# Patient Record
Sex: Male | Born: 1960 | Race: White | Hispanic: No | Marital: Married | Smoking: Never smoker
Health system: Southern US, Community
[De-identification: ages and names within clinical notes are randomized; demographics above are authoritative.]

## PROBLEM LIST (undated history)

## (undated) DIAGNOSIS — I341 Nonrheumatic mitral (valve) prolapse: Secondary | ICD-10-CM

---

## 2015-08-29 ENCOUNTER — Emergency Department (HOSPITAL_BASED_OUTPATIENT_CLINIC_OR_DEPARTMENT_OTHER): Payer: 59

## 2015-08-29 ENCOUNTER — Encounter (HOSPITAL_BASED_OUTPATIENT_CLINIC_OR_DEPARTMENT_OTHER): Payer: Self-pay | Admitting: *Deleted

## 2015-08-29 ENCOUNTER — Emergency Department (HOSPITAL_BASED_OUTPATIENT_CLINIC_OR_DEPARTMENT_OTHER)
Admission: EM | Admit: 2015-08-29 | Discharge: 2015-08-29 | Disposition: A | Discharge status: Home / Self Care | Payer: 59 | Attending: Emergency Medicine | Admitting: Emergency Medicine

## 2015-08-29 DIAGNOSIS — N23 Unspecified renal colic: Secondary | ICD-10-CM | POA: Insufficient documentation

## 2015-08-29 DIAGNOSIS — Z8679 Personal history of other diseases of the circulatory system: Secondary | ICD-10-CM | POA: Insufficient documentation

## 2015-08-29 DIAGNOSIS — R109 Unspecified abdominal pain: Secondary | ICD-10-CM | POA: Diagnosis present

## 2015-08-29 HISTORY — DX: Nonrheumatic mitral (valve) prolapse: I34.1

## 2015-08-29 LAB — URINALYSIS, ROUTINE W REFLEX MICROSCOPIC
BILIRUBIN URINE: NEGATIVE
Glucose, UA: NEGATIVE mg/dL
Ketones, ur: 15 mg/dL — AB
Leukocytes, UA: NEGATIVE
NITRITE: NEGATIVE
PROTEIN: 30 mg/dL — AB
Specific Gravity, Urine: 1.03 (ref 1.005–1.030)
pH: 6 (ref 5.0–8.0)

## 2015-08-29 LAB — URINE MICROSCOPIC-ADD ON

## 2015-08-29 MED ORDER — TAMSULOSIN HCL 0.4 MG PO CAPS: 0.4000 mg | ORAL_CAPSULE | Freq: Every day | ORAL | Status: DC

## 2015-08-29 MED ORDER — HYDROCODONE-ACETAMINOPHEN 5-325 MG PO TABS
1.0000 | ORAL_TABLET | Freq: Four times a day (QID) | ORAL | Status: AC | PRN
Start: 2015-08-29 — End: ?

## 2015-08-29 MED ORDER — IBUPROFEN 800 MG PO TABS: 800.0000 mg | ORAL_TABLET | Freq: Three times a day (TID) | ORAL | Status: AC

## 2015-08-29 NOTE — ED Notes (Signed)
Pt states awoke at 0300hrs with acute rt flank pain, at approx 0800hrs went to urgent care, rec Toradol and Norco, now pain free.

## 2015-08-29 NOTE — ED Notes (Signed)
Right flank pain since 0300

## 2015-08-29 NOTE — Discharge Instructions (Signed)
Take motrin 800 mg every 6 hrs for the next 2 days then as needed.   Take vicodin if you have severe pain.   Take flomax daily to help you urinate the stone.   Call urology in a couple days if you still have pain.   Drink plenty of fluids.   Return to ER if you have severe pain, vomiting, fever, unable to urinate.

## 2015-08-29 NOTE — ED Notes (Signed)
Pt ambulating with no assistance, seemingly with no problems.

## 2015-08-29 NOTE — ED Notes (Signed)
DC instructions reviewed with pt and other, discussed safety while taking PO narcotics, also importance of taking Flomax also, Discussed when the need arises to return to the ED. Opportunity for questions provided

## 2015-08-29 NOTE — ED Notes (Signed)
MD at bedside. 

## 2015-08-29 NOTE — ED Provider Notes (Signed)
CSN: [id number]     Arrival date & time 08/29/15  1121 History   First MD Initiated Contact with Patient 08/29/15 1137     Chief Complaint  Patient presents with  . Flank Pain     (Consider location/radiation/quality/duration/timing/severity/associated sxs/prior Treatment) The history is provided by the patient.  Barry Wong is a 55 y.o. male hx of mitral valve prolapse here with R flank pain. Acute right flank pain started at 3 AM that woke him up. Pain was severe and sharp. Went to urgent care at 8 AM and received a Toradol and Phenergan IM and some norco, now pain free. Denies fever or vomiting. No hx of kidney stones. Denies dysuria or obvious hematuria. He drives around a lot and doesn't usually drink a lot of fluids.     Past Medical History  Diagnosis Date  . Mitral valve prolapse    History reviewed. No pertinent past surgical history. No family history on file. Social History  Substance Use Topics  . Smoking status: Never Smoker   . Smokeless tobacco: Never Used  . Alcohol Use: No    Review of Systems  Genitourinary: Positive for flank pain.  All other systems reviewed and are negative.     Allergies  Review of patient's allergies indicates no known allergies.  Home Medications   Prior to Admission medications   Not on File   BP 140/91 mmHg  Pulse 101  Temp(Src) 98.1 F (36.7 C) (Oral)  Resp 18  Ht  (1.753 m)  Wt 170 lb (77.111 kg)  BMI 25.09 kg/m2  SpO2 100% Physical Exam  Constitutional: He is oriented to person, place, and time. He appears well-developed and well-nourished.  HENT:  Head: Normocephalic.  Mouth/Throat: Oropharynx is clear and moist.  Eyes: Conjunctivae are normal. Pupils are equal, round, and reactive to light.  Neck: Normal range of motion. Neck supple.  Cardiovascular: Normal rate, regular rhythm and normal heart sounds.   Pulmonary/Chest: Effort normal and breath sounds normal. No respiratory distress. He has no wheezes. He  has no rales.  Abdominal: Soft. Bowel sounds are normal. He exhibits no distension. There is no tenderness. There is no rebound.  No CVAT   Musculoskeletal: Normal range of motion. He exhibits no edema or tenderness.  Neurological: He is alert and oriented to person, place, and time. No cranial nerve deficit. Coordination normal.  Skin: Skin is warm and dry.  Psychiatric: He has a normal mood and affect. His behavior is normal. Judgment and thought content normal.  Nursing note and vitals reviewed.   ED Course  Procedures (including critical care time)  EMERGENCY DEPARTMENT US RENAL EXAM  "Study: Limited Retroperitoneal Ultrasound of Kidneys"  INDICATIONS: Flank pain  Long and short axis of both kidneys were obtained.   PERFORMED BY: Myself  IMAGES ARCHIVED?: Yes  LIMITATIONS: Body habitus  VIEWS USED: Long axis and Short axis   INTERPRETATION: Right Hydronephrosis moderate   CPT Code: [phone] (limited retroperitoneal)    Labs Review Labs Reviewed  URINALYSIS, ROUTINE W REFLEX MICROSCOPIC (NOT AT [facility]) - Abnormal; Notable for the following:    Color, Urine AMBER (*)    APPearance CLOUDY (*)    Hgb urine dipstick LARGE (*)    Ketones, ur 15 (*)    Protein, ur 30 (*)    All other components within normal limits  URINE MICROSCOPIC-ADD ON - Abnormal; Notable for the following:    Squamous Epithelial / LPF 0-5 (*)    Bacteria, UA  MANY (*)    All other components within normal limits    Imaging Review Dg Abd 1 View  08/29/2015  CLINICAL DATA:  Acute right flank pain since 3 a.m. EXAM: ABDOMEN - 1 VIEW COMPARISON:  None available FINDINGS: Renal shadows are obscured by stool and gas pattern. 3 mm radiodense calculus at the level of the right L3 transverse process, nonspecific but could represent a small ureteral calculus. No acute osseous finding. IMPRESSION: Limited assessment for nephrolithiasis. Punctate 3 mm radiodense calculus at the right L3 transverse process level  by plain radiography, nonspecific. Electronically Signed   By: Judie PetitM.  Shick M.D.   On: 08/29/2015 12:24   Ct Renal Stone Study  08/29/2015  CLINICAL DATA:  55 year old male with acute right flank and abdominal pain and hematuria. EXAM: CT ABDOMEN AND PELVIS WITHOUT CONTRAST TECHNIQUE: Multidetector CT imaging of the abdomen and pelvis was performed following the standard protocol without IV contrast. COMPARISON:  None. FINDINGS: Please note that parenchymal abnormalities may be missed without intravenous contrast. Lower chest:  No acute abnormalities. Hepatobiliary: Hepatic steatosis identified without focal hepatic lesion. The gallbladder is unremarkable. There is no evidence of biliary dilatation. Pancreas: Unremarkable Spleen: Unremarkable Adrenals/Urinary Tract: A 4 mm upper-mid right ureteral calculus causes moderate right hydronephrosis and right perinephric inflammation. No other urinary calculi identified. The adrenal glands and bladder are unremarkable. Stomach/Bowel: Unremarkable. There is no evidence of bowel obstruction or definite focal bowel wall thickening. Vascular/Lymphatic: There is no evidence of abdominal aortic aneurysm or enlarged lymph nodes. Reproductive: Prostate enlargement noted. Other: No free fluid, focal collection or pneumoperitoneum. Musculoskeletal: No acute abnormalities noted. IMPRESSION: 4 mm upper-mid right ureteral calculus causing moderate right hydronephrosis. Hepatic steatosis. Prostate enlargement. Electronically Signed   By: Harmon PierJeffrey  Hu M.D.   On: 08/29/2015 13:15   I have personally reviewed and evaluated these images and lab results as part of my medical decision-making.   EKG Interpretation None      MDM   Final diagnoses:  None    Barry Wong is a 55 y.o. male here with R flank pain that resolved. No dysuria, afebrile, well appearing. Likely small kidney stone vs recently passed kidney stone. Urgent care sent him for evaluation. I discussed imaging  options including CT renal stone, xrays, US, and I think he can just get xray to r/o large stone or confirm stone passage.  1:28 PM  Xray showed 3 mm stone. US showed moderate hydro, which is more than I expect. So I ordered CT renal stone, which showed 4 mm mid R ureteral stone with moderate hydro. Afebrile, UA showed no infection. Didn't require pain meds in the ED. Will dc home with motrin, vicodin, flomax, urology referral     Richardean Canalavid H Yao, MD 08/29/15 1330

## 2017-08-22 IMAGING — DX DG ABDOMEN 1V
1 series · 1 of 1 positions shown · non-contrast
Comparison: None available

CLINICAL DATA: Acute right flank pain since 3 a.m..

EXAM:
ABDOMEN - 1 VIEW

[abdomen kub]
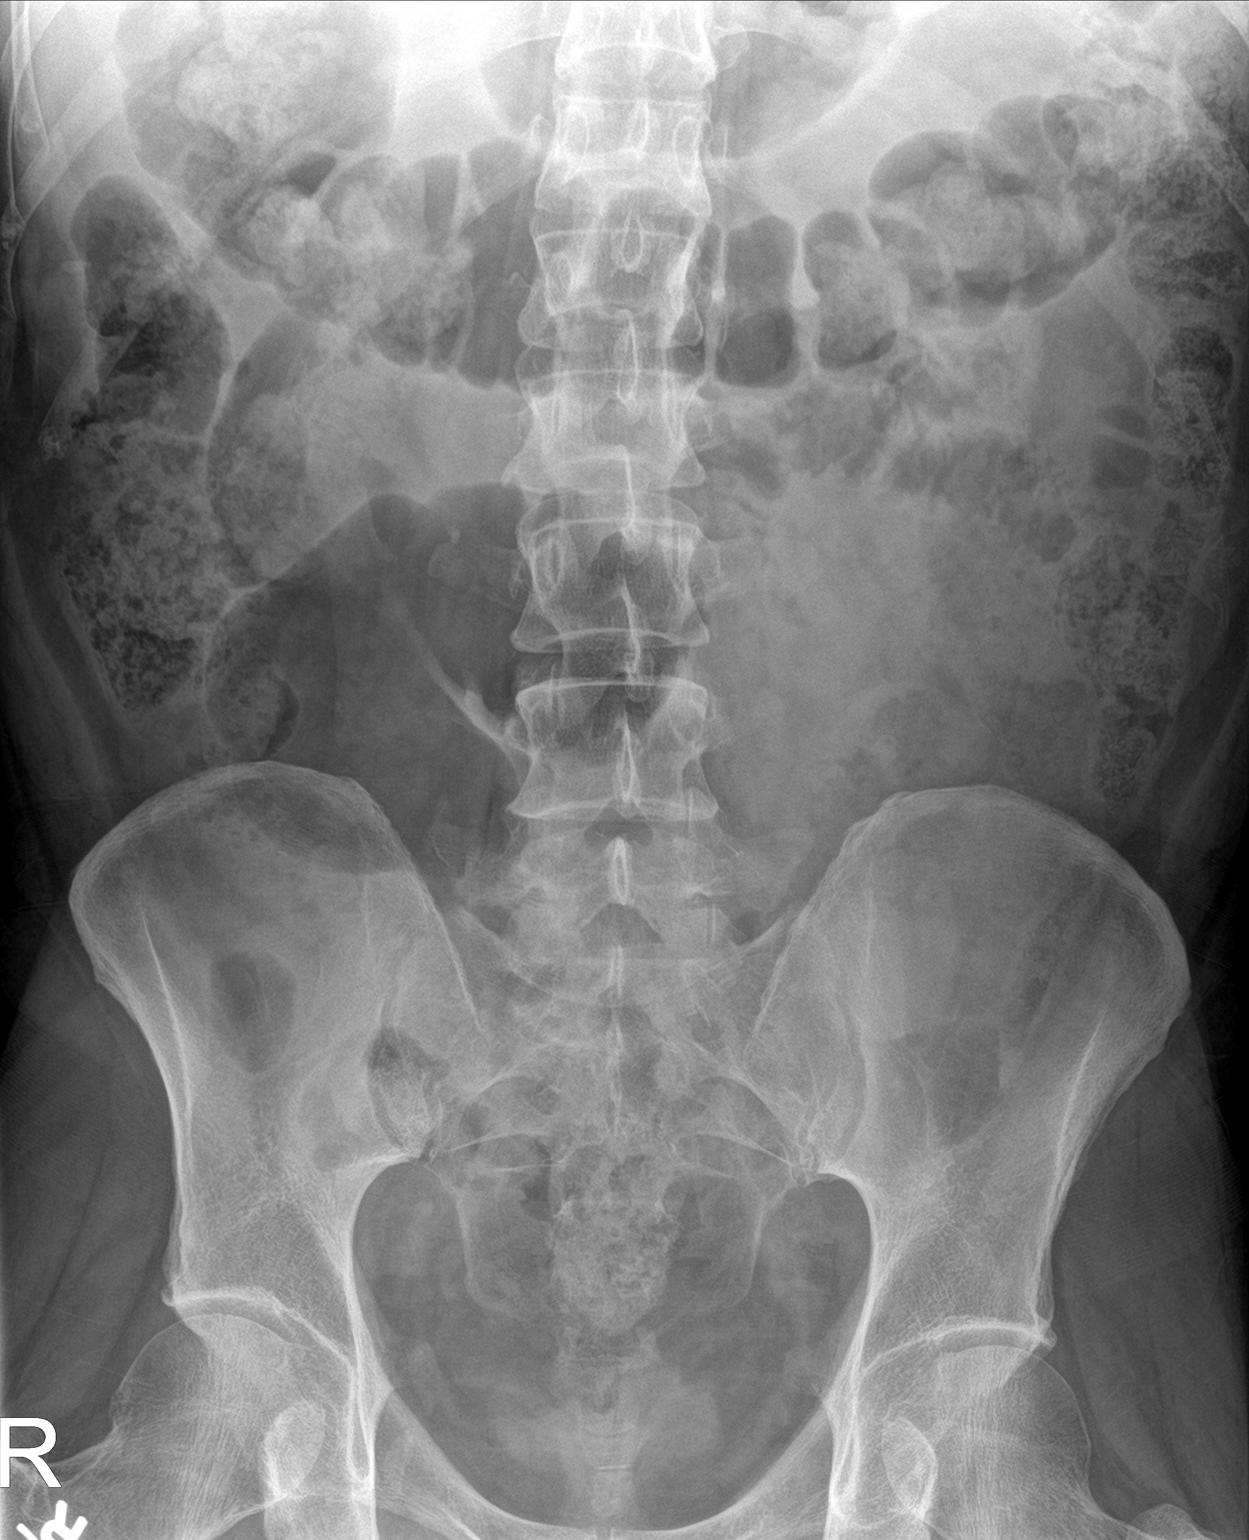

[1 of 1 positions shown; findings below may reference images not displayed]

FINDINGS: Renal shadows are obscured by stool and gas pattern. 3 mm radiodense
calculus at the level of the right L3 transverse process,
nonspecific but could represent a small ureteral calculus. No acute
osseous finding.
IMPRESSION: Limited assessment for nephrolithiasis.

Punctate 3 mm radiodense calculus at the right L3 transverse process
level by plain radiography, nonspecific.

## 2020-11-22 ENCOUNTER — Other Ambulatory Visit: Payer: Self-pay

## 2020-11-22 ENCOUNTER — Encounter (HOSPITAL_COMMUNITY): Payer: Self-pay

## 2020-11-22 ENCOUNTER — Ambulatory Visit (HOSPITAL_COMMUNITY)
Admission: EM | Admit: 2020-11-22 | Discharge: 2020-11-22 | Disposition: A | Discharge status: Home / Self Care | Payer: 59 | Attending: Emergency Medicine | Admitting: Emergency Medicine

## 2020-11-22 DIAGNOSIS — R109 Unspecified abdominal pain: Secondary | ICD-10-CM

## 2020-11-22 DIAGNOSIS — R319 Hematuria, unspecified: Secondary | ICD-10-CM

## 2020-11-22 LAB — POCT URINALYSIS DIPSTICK, ED / UC
Bilirubin Urine: NEGATIVE
Glucose, UA: NEGATIVE mg/dL
Ketones, ur: NEGATIVE mg/dL
Leukocytes,Ua: NEGATIVE
Nitrite: NEGATIVE
Protein, ur: NEGATIVE mg/dL
Specific Gravity, Urine: 1.025 (ref 1.005–1.030)
Urobilinogen, UA: 0.2 mg/dL (ref 0.0–1.0)
pH: 5.5 (ref 5.0–8.0)

## 2020-11-22 MED ORDER — TAMSULOSIN HCL 0.4 MG PO CAPS: 0.4000 mg | ORAL_CAPSULE | Freq: Every day | ORAL | 0 refills | Status: AC

## 2020-11-22 NOTE — Discharge Instructions (Addendum)
Take the Flomax daily.  Make sure you are drinking plenty of fluids, especially water.    You can take Ibuprofen 800mg  three times a day or Tylenol 500mg  three times a day as needed for pain.    If you notice bright red blood in your urine, are unable to urinate, or pain gets worse, go to the ED for further evaluation.    Return or go to the Emergency Department if symptoms worsen or do not improve in the next few days.

## 2020-11-22 NOTE — ED Provider Notes (Signed)
[facility]    CSN: [id number] Arrival date & time: 11/22/20  1632      History   Chief Complaint Chief Complaint  Patient presents with   Urinary Frequency   Flank Pain    HPI Barry Wong is a 60 y.o. male.   Patient here for evaluation of urinary frequency, urgency, and flank pain that started last night.  Also reports having some chills but did not check temperature.  Reports having similar symptoms approximately 5 years ago and was diagnosed with a kidney stone.  Denies any burning or difficulty urinating.  Denies any trauma, injury, or other precipitating event.  Denies any specific alleviating or aggravating factors.  Denies any fevers, chest pain, shortness of breath, N/V/D, numbness, tingling, weakness, abdominal pain, or headaches.     The history is provided by the patient.  Urinary Frequency  Flank Pain   Past Medical History:  Diagnosis Date   Mitral valve prolapse     There are no problems to display for this patient.   History reviewed. No pertinent surgical history.     Home Medications    Prior to Admission medications   Medication Sig Start Date End Date Taking? Authorizing Provider  tamsulosin (FLOMAX) 0.4 MG CAPS capsule Take 1 capsule (0.4 mg total) by mouth daily. 11/22/20  Yes Ivette Loyal, NP  HYDROcodone-acetaminophen (NORCO/VICODIN) 5-325 MG tablet Take 1 tablet by mouth every 6 (six) hours as needed. 08/29/15   Charlynne Pander, MD  ibuprofen (ADVIL,MOTRIN) 800 MG tablet Take 1 tablet (800 mg total) by mouth 3 (three) times daily. 08/29/15   Charlynne Pander, MD    Family History History reviewed. No pertinent family history.  Social History Social History   Tobacco Use   Smoking status: Never   Smokeless tobacco: Never  Substance Use Topics   Alcohol use: No   Drug use: No     Allergies   Patient has no known allergies.   Review of Systems Review of Systems  Genitourinary:  Positive for flank pain,  frequency and urgency.  All other systems reviewed and are negative.   Physical Exam Triage Vital Signs ED Triage Vitals  Enc Vitals Group     BP 11/22/20 1654 (!) 159/91     Pulse Rate 11/22/20 1654 90     Resp 11/22/20 1654 18     Temp 11/22/20 1654 99.5 F (37.5 C)     Temp Source 11/22/20 1654 Oral     SpO2 11/22/20 1654 100 %     Weight --      Height --      Head Circumference --      Peak Flow --      Pain Score 11/22/20 1655 5     Pain Loc --      Pain Edu? --      Excl. in GC? --    No data found.  Updated Vital Signs BP (!) 159/91 (BP Location: Left Arm)   Pulse 90   Temp 99.5 F (37.5 C) (Oral)   Resp 18   SpO2 100%   Visual Acuity Right Eye Distance:   Left Eye Distance:   Bilateral Distance:    Right Eye Near:   Left Eye Near:    Bilateral Near:     Physical Exam Vitals and nursing note reviewed.  Constitutional:      General: He is not in acute distress.    Appearance: Normal appearance. He is not  ill-appearing, toxic-appearing or diaphoretic.  HENT:     Head: Normocephalic and atraumatic.  Eyes:     Conjunctiva/sclera: Conjunctivae normal.  Cardiovascular:     Rate and Rhythm: Normal rate.     Pulses: Normal pulses.  Pulmonary:     Effort: Pulmonary effort is normal.  Abdominal:     General: Abdomen is flat.     Tenderness: There is no right CVA tenderness or left CVA tenderness.  Musculoskeletal:        General: Normal range of motion.     Cervical back: Normal range of motion.  Skin:    General: Skin is warm and dry.  Neurological:     General: No focal deficit present.     Mental Status: He is alert and oriented to person, place, and time.  Psychiatric:        Mood and Affect: Mood normal.     UC Treatments / Results  Labs (all labs ordered are listed, but only abnormal results are displayed) Labs Reviewed  POCT URINALYSIS DIPSTICK, ED / UC - Abnormal; Notable for the following components:      Result Value   Hgb urine  dipstick TRACE (*)    All other components within normal limits    EKG   Radiology No results found.  Procedures Procedures (including critical care time)  Medications Ordered in UC Medications - No data to display  Initial Impression / Assessment and Plan / UC Course  I have reviewed the triage vital signs and the nursing notes.  Pertinent labs & imaging results that were available during my care of the patient were reviewed by me and considered in my medical decision making (see chart for details).    Assessment negative for red flags or concerns.  Urinalysis with trace hemoglobin but otherwise negative.  With history of kidney stone and hematuria.  Consider likelihood of a another kidney stone at this time.  Will treat with Flomax daily.  May take Tylenol and/or ibuprofen as needed for pain.  Encourage fluids.  Strict ED follow-up for any worsening symptoms including inability to urinate, gross hematuria, or worsening pain.  Otherwise follow-up with primary care as needed. Final Clinical Impressions(s) / UC Diagnoses   Final diagnoses:  Flank pain  Hematuria, unspecified type     Discharge Instructions      Take the Flomax daily.  Make sure you are drinking plenty of fluids, especially water.    You can take Ibuprofen 800mg  three times a day or Tylenol 500mg  three times a day as needed for pain.    If you notice bright red blood in your urine, are unable to urinate, or pain gets worse, go to the ED for further evaluation.    Return or go to the Emergency Department if symptoms worsen or do not improve in the next few days.        ED Prescriptions     Medication Sig Dispense Auth. Provider   tamsulosin (FLOMAX) 0.4 MG CAPS capsule Take 1 capsule (0.4 mg total) by mouth daily. 30 capsule , NP      PDMP not reviewed this encounter.   , NP 11/22/20 1734

## 2020-11-22 NOTE — ED Triage Notes (Signed)
Pt present urinary frequency with urgency and flank pain. Symptom started between last night and today. Flank pain started today. Pt states he can go to the bathroom and after he finish his bladder is full again for him too.
